# Patient Record
Sex: Female | Born: 1970
Health system: Southern US, Community
[De-identification: ages and names within clinical notes are randomized; demographics above are authoritative.]

## PROBLEM LIST (undated history)

## (undated) DIAGNOSIS — Z8711 Personal history of peptic ulcer disease: Secondary | ICD-10-CM

## (undated) DIAGNOSIS — Z8719 Personal history of other diseases of the digestive system: Secondary | ICD-10-CM

## (undated) DIAGNOSIS — N289 Disorder of kidney and ureter, unspecified: Secondary | ICD-10-CM

## (undated) DIAGNOSIS — E119 Type 2 diabetes mellitus without complications: Secondary | ICD-10-CM

## (undated) DIAGNOSIS — H543 Unqualified visual loss, both eyes: Secondary | ICD-10-CM

## (undated) DIAGNOSIS — F319 Bipolar disorder, unspecified: Secondary | ICD-10-CM

## (undated) DIAGNOSIS — E079 Disorder of thyroid, unspecified: Secondary | ICD-10-CM

## (undated) HISTORY — PX: TUBAL LIGATION: SHX77

---

## 2014-06-03 ENCOUNTER — Emergency Department (HOSPITAL_COMMUNITY)
Admission: EM | Admit: 2014-06-03 | Discharge: 2014-06-03 | Disposition: A | Discharge status: Home / Self Care | Payer: Medicare Other | Attending: Emergency Medicine | Admitting: Emergency Medicine

## 2014-06-03 ENCOUNTER — Emergency Department (HOSPITAL_COMMUNITY): Payer: Medicare Other

## 2014-06-03 ENCOUNTER — Encounter (HOSPITAL_COMMUNITY): Payer: Self-pay | Admitting: Emergency Medicine

## 2014-06-03 DIAGNOSIS — F319 Bipolar disorder, unspecified: Secondary | ICD-10-CM | POA: Diagnosis not present

## 2014-06-03 DIAGNOSIS — Z87448 Personal history of other diseases of urinary system: Secondary | ICD-10-CM | POA: Diagnosis not present

## 2014-06-03 DIAGNOSIS — R197 Diarrhea, unspecified: Secondary | ICD-10-CM | POA: Insufficient documentation

## 2014-06-03 DIAGNOSIS — Z79899 Other long term (current) drug therapy: Secondary | ICD-10-CM | POA: Diagnosis not present

## 2014-06-03 DIAGNOSIS — Z3202 Encounter for pregnancy test, result negative: Secondary | ICD-10-CM | POA: Insufficient documentation

## 2014-06-03 DIAGNOSIS — Z9889 Other specified postprocedural states: Secondary | ICD-10-CM | POA: Diagnosis not present

## 2014-06-03 DIAGNOSIS — Z794 Long term (current) use of insulin: Secondary | ICD-10-CM | POA: Insufficient documentation

## 2014-06-03 DIAGNOSIS — Z9851 Tubal ligation status: Secondary | ICD-10-CM | POA: Diagnosis not present

## 2014-06-03 DIAGNOSIS — R112 Nausea with vomiting, unspecified: Secondary | ICD-10-CM

## 2014-06-03 DIAGNOSIS — E119 Type 2 diabetes mellitus without complications: Secondary | ICD-10-CM | POA: Diagnosis not present

## 2014-06-03 DIAGNOSIS — A5903 Trichomonal cystitis and urethritis: Secondary | ICD-10-CM | POA: Diagnosis not present

## 2014-06-03 DIAGNOSIS — H540 Blindness, both eyes: Secondary | ICD-10-CM | POA: Insufficient documentation

## 2014-06-03 DIAGNOSIS — Z720 Tobacco use: Secondary | ICD-10-CM | POA: Insufficient documentation

## 2014-06-03 DIAGNOSIS — N390 Urinary tract infection, site not specified: Secondary | ICD-10-CM

## 2014-06-03 DIAGNOSIS — R1084 Generalized abdominal pain: Secondary | ICD-10-CM | POA: Diagnosis present

## 2014-06-03 DIAGNOSIS — Z8719 Personal history of other diseases of the digestive system: Secondary | ICD-10-CM | POA: Insufficient documentation

## 2014-06-03 DIAGNOSIS — E039 Hypothyroidism, unspecified: Secondary | ICD-10-CM | POA: Insufficient documentation

## 2014-06-03 HISTORY — DX: Type 2 diabetes mellitus without complications: E11.9

## 2014-06-03 HISTORY — DX: Personal history of peptic ulcer disease: Z87.11

## 2014-06-03 HISTORY — DX: Personal history of other diseases of the digestive system: Z87.19

## 2014-06-03 HISTORY — DX: Unqualified visual loss, both eyes: H54.3

## 2014-06-03 HISTORY — DX: Disorder of thyroid, unspecified: E07.9

## 2014-06-03 HISTORY — DX: Bipolar disorder, unspecified: F31.9

## 2014-06-03 HISTORY — DX: Disorder of kidney and ureter, unspecified: N28.9

## 2014-06-03 LAB — COMPREHENSIVE METABOLIC PANEL
ALT: 12 U/L (ref 0–35)
AST: 13 U/L (ref 0–37)
Albumin: 3.5 g/dL (ref 3.5–5.2)
Alkaline Phosphatase: 70 U/L (ref 39–117)
Anion gap: 15 (ref 5–15)
BUN: 14 mg/dL (ref 6–23)
CALCIUM: 9.1 mg/dL (ref 8.4–10.5)
CO2: 20 mEq/L (ref 19–32)
Chloride: 97 mEq/L (ref 96–112)
Creatinine, Ser: 0.65 mg/dL (ref 0.50–1.10)
GFR calc Af Amer: >90 mL/min (ref >90)
GFR calc non Af Amer: >90 mL/min (ref >90)
Glucose, Bld: 328 mg/dL — ABNORMAL HIGH (ref 70–99)
Potassium: 3.6 mEq/L — ABNORMAL LOW (ref 3.7–5.3)
Sodium: 132 mEq/L — ABNORMAL LOW (ref 137–147)
Total Bilirubin: <0.2 mg/dL — ABNORMAL LOW (ref 0.3–1.2)
Total Protein: 6.9 g/dL (ref 6.0–8.3)

## 2014-06-03 LAB — URINALYSIS, ROUTINE W REFLEX MICROSCOPIC
Bilirubin Urine: NEGATIVE
Glucose, UA: >1000 mg/dL — AB
Ketones, ur: 40 mg/dL — AB
Nitrite: NEGATIVE
PROTEIN: NEGATIVE mg/dL
Specific Gravity, Urine: 1.036 — ABNORMAL HIGH (ref 1.005–1.030)
UROBILINOGEN UA: 0.2 mg/dL (ref 0.0–1.0)
pH: 5.5 (ref 5.0–8.0)

## 2014-06-03 LAB — CBC WITH DIFFERENTIAL/PLATELET
BASOS ABS: 0 10*3/uL (ref 0.0–0.1)
Basophils Relative: 0 % (ref 0–1)
EOS ABS: 0.2 10*3/uL (ref 0.0–0.7)
Eosinophils Relative: 2 % (ref 0–5)
HEMATOCRIT: 38.8 % (ref 36.0–46.0)
Hemoglobin: 13.4 g/dL (ref 12.0–15.0)
Lymphocytes Relative: 14 % (ref 12–46)
Lymphs Abs: 1.5 10*3/uL (ref 0.7–4.0)
MCH: 31.2 pg (ref 26.0–34.0)
MCHC: 34.5 g/dL (ref 30.0–36.0)
MCV: 90.4 fL (ref 78.0–100.0)
Monocytes Absolute: 1 10*3/uL (ref 0.1–1.0)
Monocytes Relative: 9 % (ref 3–12)
NEUTROS ABS: 7.8 10*3/uL — AB (ref 1.7–7.7)
Neutrophils Relative %: 75 % (ref 43–77)
Platelets: 283 10*3/uL (ref 150–400)
RBC: 4.29 MIL/uL (ref 3.87–5.11)
RDW: 12.7 % (ref 11.5–15.5)
WBC: 10.6 10*3/uL — ABNORMAL HIGH (ref 4.0–10.5)

## 2014-06-03 LAB — URINE MICROSCOPIC-ADD ON

## 2014-06-03 LAB — CBG MONITORING, ED: Glucose-Capillary: 300 mg/dL — ABNORMAL HIGH (ref 70–99)

## 2014-06-03 LAB — PREGNANCY, URINE: PREG TEST UR: NEGATIVE

## 2014-06-03 LAB — LIPASE, BLOOD: LIPASE: 23 U/L (ref 11–59)

## 2014-06-03 MED ORDER — IOHEXOL 300 MG/ML  SOLN
50.0000 mL | Freq: Once | INTRAMUSCULAR | Status: AC | PRN
  Administered 2014-06-03: 50 mL via ORAL

## 2014-06-03 MED ORDER — ONDANSETRON HCL 4 MG/2ML IJ SOLN: 4.0000 mg | INTRAMUSCULAR | Status: DC | PRN

## 2014-06-03 MED ORDER — DICYCLOMINE HCL 20 MG PO TABS: 20.0000 mg | ORAL_TABLET | Freq: Four times a day (QID) | ORAL | Status: AC | PRN

## 2014-06-03 MED ORDER — OXYCODONE-ACETAMINOPHEN 5-325 MG PO TABS
2.0000 | ORAL_TABLET | Freq: Once | ORAL | Status: AC
  Administered 2014-06-03: 2 via ORAL
  Filled 2014-06-03: qty 2

## 2014-06-03 MED ORDER — SODIUM CHLORIDE 0.9 % IV SOLN
INTRAVENOUS | Status: DC
  Administered 2014-06-03: 10:00:00 via INTRAVENOUS

## 2014-06-03 MED ORDER — ONDANSETRON HCL 4 MG/2ML IJ SOLN
4.0000 mg | INTRAMUSCULAR | Status: AC | PRN
  Administered 2014-06-03 (×2): 4 mg via INTRAVENOUS
  Filled 2014-06-03 (×2): qty 2

## 2014-06-03 MED ORDER — DEXTROSE 5 % IV SOLN
1.0000 g | Freq: Once | INTRAVENOUS | Status: AC
  Administered 2014-06-03: 1 g via INTRAVENOUS
  Filled 2014-06-03: qty 10

## 2014-06-03 MED ORDER — CEPHALEXIN 500 MG PO CAPS: 500.0000 mg | ORAL_CAPSULE | Freq: Four times a day (QID) | ORAL | Status: AC

## 2014-06-03 MED ORDER — SODIUM CHLORIDE 0.9 % IV BOLUS (SEPSIS)
1000.0000 mL | Freq: Once | INTRAVENOUS | Status: AC
  Administered 2014-06-03: 1000 mL via INTRAVENOUS

## 2014-06-03 MED ORDER — MORPHINE SULFATE 4 MG/ML IJ SOLN
4.0000 mg | INTRAMUSCULAR | Status: AC | PRN
  Administered 2014-06-03 (×2): 4 mg via INTRAVENOUS
  Filled 2014-06-03 (×2): qty 1

## 2014-06-03 MED ORDER — PROMETHAZINE HCL 25 MG PO TABS: 25.0000 mg | ORAL_TABLET | Freq: Four times a day (QID) | ORAL | Status: AC | PRN

## 2014-06-03 MED ORDER — METRONIDAZOLE 500 MG PO TABS
2000.0000 mg | ORAL_TABLET | Freq: Once | ORAL | Status: AC
  Administered 2014-06-03: 2000 mg via ORAL
  Filled 2014-06-03: qty 4

## 2014-06-03 NOTE — ED Provider Notes (Signed)
CSN: [id number]     Arrival date & time 06/03/14  4132 History   First MD Initiated Contact with Patient 06/03/14 [phone]     Chief Complaint  Patient presents with  . Abdominal Pain  . Emesis  . Diarrhea    HPI Pt was seen at 1010. Per pt, c/o gradual onset and persistence of multiple intermittent episodes of N/V/D that began 1 week ago.   Describes the stools as "watery." Has been associated with generalized abd "pain." States she has been taking OTC imodium without improvement in her symptoms. Denies CP/SOB, no back pain, no fevers, no black or blood in stools or emesis.     Past Medical History  Diagnosis Date  . Blindness of both eyes   . History of stomach ulcers   . Diabetes mellitus without complication   . Thyroid disease     hypothyroidism  . Renal disorder   . Kidney disease   . Bipolar disorder    Past Surgical History  Procedure Laterality Date  . Cesarean section    . Tubal ligation      History  Substance Use Topics  . Smoking status: Current Some Day Smoker    Types: Cigarettes  . Smokeless tobacco: Not on file  . Alcohol Use: No    Review of Systems ROS: Statement: All systems negative except as marked or noted in the HPI; Constitutional: Negative for fever and chills. ; ; Eyes: Negative for eye pain, redness and discharge. ; ; ENMT: Negative for ear pain, hoarseness, nasal congestion, sinus pressure and sore throat. ; ; Cardiovascular: Negative for chest pain, palpitations, diaphoresis, dyspnea and peripheral edema. ; ; Respiratory: Negative for cough, wheezing and stridor. ; ; Gastrointestinal: +N/V/D, abd pain. Negative for blood in stool, hematemesis, jaundice and rectal bleeding. . ; ; Genitourinary: Negative for dysuria, flank pain and hematuria. ; ; Musculoskeletal: Negative for back pain and neck pain. Negative for swelling and trauma.; ; Skin: Negative for pruritus, rash, abrasions, blisters, bruising and skin lesion.; ; Neuro: Negative for headache,  lightheadedness and neck stiffness. Negative for weakness, altered level of consciousness , altered mental status, extremity weakness, paresthesias, involuntary movement, seizure and syncope.      Allergies  Biaxin; Septra; and Sulfa antibiotics  Home Medications   Prior to Admission medications   Medication Sig Start Date End Date Taking? Authorizing Provider  escitalopram (LEXAPRO) 20 MG tablet Take 20 mg by mouth daily.   Yes Historical Provider, MD  influenza vac recombinant HA trivalent (FLUBLOK) injection Inject 0.5 mLs into the muscle once.   Yes Historical Provider, MD  insulin glargine (LANTUS) 100 UNIT/ML injection Inject 30 Units into the skin at bedtime.   Yes Historical Provider, MD  Insulin Glulisine (APIDRA SOLOSTAR) 100 UNIT/ML Solostar Pen Inject 12 Units into the skin daily. Takes at [employer] of the day   Yes Historical Provider, MD  levothyroxine (SYNTHROID, LEVOTHROID) 125 MCG tablet Take 125 mcg by mouth daily before breakfast.   Yes Historical Provider, MD  lisinopril (PRINIVIL,ZESTRIL) 20 MG tablet Take 20 mg by mouth daily.   Yes Historical Provider, MD  pantoprazole (PROTONIX) 40 MG tablet Take 40 mg by mouth daily.   Yes Historical Provider, MD  topiramate (TOPAMAX) 100 MG tablet Take 100 mg by mouth 3 (three) times daily.   Yes Historical Provider, MD  trazodone (DESYREL) 300 MG tablet Take 300 mg by mouth at bedtime.   Yes Historical Provider, MD   BP 135/81  Pulse  70  Temp(Src) 98.1 F (36.7 C) (Oral)  Resp 16  SpO2 98%  LMP 05/20/2014 Physical Exam 1015: Physical examination:  Nursing notes reviewed; Vital signs and O2 SAT reviewed;  Constitutional: Well developed, Well nourished, Well hydrated, In no acute distress; Head:  Normocephalic, atraumatic; Eyes: EOMI, PERRL, No scleral icterus; ENMT: Mouth and pharynx normal, Mucous membranes moist; Neck: Supple, Full range of motion, No lymphadenopathy; Cardiovascular: Regular rate and rhythm, No murmur,  rub, or gallop; Respiratory: Breath sounds clear & equal bilaterally, No rales, rhonchi, wheezes.  Speaking full sentences with ease, Normal respiratory effort/excursion; Chest: Nontender, Movement normal; Abdomen: Soft, +mild diffuse tenderness to palp. No rebound or guarding. Nondistended, Normal bowel sounds; Genitourinary: No CVA tenderness; Extremities: Pulses normal, No tenderness, No edema, No calf edema or asymmetry.; Neuro: AA&Ox3, +blind per hx, otherwise major CN grossly intact.  Speech clear. No gross focal motor or sensory deficits in extremities.; Skin: Color normal, Warm, Dry.   ED Course  Procedures     EKG Interpretation None      MDM  MDM Reviewed: nursing note and vitals Interpretation: labs, CT scan and ECG     Date: 06/03/2014  Rate: 74  Rhythm: normal sinus rhythm  QRS Axis: normal  Intervals: normal  ST/T Wave abnormalities: normal  Conduction Disutrbances:none  Narrative Interpretation:   Old EKG Reviewed: none available.   Results for orders placed during the hospital encounter of 06/03/14  URINALYSIS, ROUTINE W REFLEX MICROSCOPIC      Result Value Ref Range   Color, Urine YELLOW  YELLOW   APPearance CLOUDY (*) CLEAR   Specific Gravity, Urine 1.036 (*) 1.005 - 1.030   pH 5.5  5.0 - 8.0   Glucose, UA >1000 (*) NEGATIVE mg/dL   Hgb urine dipstick TRACE (*) NEGATIVE   Bilirubin Urine NEGATIVE  NEGATIVE   Ketones, ur 40 (*) NEGATIVE mg/dL   Protein, ur NEGATIVE  NEGATIVE mg/dL   Urobilinogen, UA 0.2  0.0 - 1.0 mg/dL   Nitrite NEGATIVE  NEGATIVE   Leukocytes, UA SMALL (*) NEGATIVE  PREGNANCY, URINE      Result Value Ref Range   Preg Test, Ur NEGATIVE  NEGATIVE  CBC WITH DIFFERENTIAL      Result Value Ref Range   WBC 10.6 (*) 4.0 - 10.5 K/uL   RBC 4.29  3.87 - 5.11 MIL/uL   Hemoglobin 13.4  12.0 - 15.0 g/dL   HCT 16.1  09.6 - 04.5 %   MCV 90.4  78.0 - 100.0 fL   MCH 31.2  26.0 - 34.0 pg   MCHC 34.5  30.0 - 36.0 g/dL   RDW 40.9  81.1 - 91.4 %    Platelets 283  150 - 400 K/uL   Neutrophils Relative % 75  43 - 77 %   Neutro Abs 7.8 (*) 1.7 - 7.7 K/uL   Lymphocytes Relative 14  12 - 46 %   Lymphs Abs 1.5  0.7 - 4.0 K/uL   Monocytes Relative 9  3 - 12 %   Monocytes Absolute 1.0  0.1 - 1.0 K/uL   Eosinophils Relative 2  0 - 5 %   Eosinophils Absolute 0.2  0.0 - 0.7 K/uL   Basophils Relative 0  0 - 1 %   Basophils Absolute 0.0  0.0 - 0.1 K/uL  COMPREHENSIVE METABOLIC PANEL      Result Value Ref Range   Sodium 132 (*) 137 - 147 mEq/L   Potassium 3.6 (*) 3.7 - 5.3  mEq/L   Chloride 97  96 - 112 mEq/L   CO2 20  19 - 32 mEq/L   Glucose, Bld 328 (*) 70 - 99 mg/dL   BUN 14  6 - 23 mg/dL   Creatinine, Ser 1.610.65  0.50 - 1.10 mg/dL   Calcium 9.1  8.4 - 09.610.5 mg/dL   Total Protein 6.9  6.0 - 8.3 g/dL   Albumin 3.5  3.5 - 5.2 g/dL   AST 13  0 - 37 U/L   ALT 12  0 - 35 U/L   Alkaline Phosphatase 70  39 - 117 U/L   Total Bilirubin <0.2 (*) 0.3 - 1.2 mg/dL   GFR calc non Af Amer >90  >90 mL/min   GFR calc Af Amer >90  >90 mL/min   Anion gap 15  5 - 15  LIPASE, BLOOD      Result Value Ref Range   Lipase 23  11 - 59 U/L  URINE MICROSCOPIC-ADD ON      Result Value Ref Range   Squamous Epithelial / LPF FEW (*) RARE   WBC, UA 7-10  <3 WBC/hpf   RBC / HPF 0-2  <3 RBC/hpf   Bacteria, UA FEW (*) RARE   Urine-Other TRICHOMONAS PRESENT     Ct Abdomen Pelvis Wo Contrast 06/03/2014   CLINICAL DATA:  Generalized abdominal pain with emesis and diarrhea for approximately 1 week  EXAM: CT ABDOMEN AND PELVIS WITHOUT CONTRAST  TECHNIQUE: Multidetector CT imaging of the abdomen and pelvis was performed following the standard protocol without IV contrast material administration. Oral contrast was administered.  COMPARISON:  None.  FINDINGS: On axial slice 7 series 6, there is a 3 mm nodular opacity in the lateral segment of the left lower lobe. There is mild scarring in each lung base. Lung bases are otherwise clear.  Liver is enlarged, measuring 19.5 cm  in length. No focal liver lesions are identified. There is no appreciable biliary duct dilatation. Gallbladder wall is not thickened.  Spleen, pancreas, and adrenals appear normal. Kidneys bilaterally show no evidence of mass, calculus, or hydronephrosis on either side. There is no ureteral calculus on either side.  In the pelvis, the urinary bladder is midline with normal wall thickness. There is no pelvic mass or fluid collection. Appendix appears normal. Terminal ileum appears normal.  There is no bowel obstruction. There is no free air or portal venous air.  There are several small lymph nodes in the right mid to lower abdomen. By size criteria, there is no lymph node enlargement in the abdomen or pelvis. There is no ascites or abscess in the abdomen or pelvis. There is no demonstrable abdominal aortic aneurysm. There are no blastic or lytic bone lesions.  IMPRESSION: Small right sided abdominal lymph nodes. Significance of this finding is uncertain. A degree of mesenteric adenitis is possible. By size criteria, there is no frank adenopathy on this study.  Appendix appears normal. No bowel obstruction. No abscess. No renal or ureteral calculi. No hydronephrosis.  There is enlargement of the liver without focal liver lesion.  There is a 3 mm nodular lesion in the left base region. Followup of this nodular opacity should be based on Fleischner Society guidelines. If the patient is at high risk for bronchogenic carcinoma, follow-up chest CT at 1 year is recommended. If the patient is at low risk, no follow-up is needed. This recommendation follows the consensus statement: Guidelines for Management of Small Pulmonary Nodules Detected on CT Scans: A  Statement from the Fleischner Society as published in Radiology 2005; 237:395-400.   Electronically Signed   By: Bretta BangWilliam  Woodruff M.D.   On: 06/03/2014 12:21    1315:  Pt has tol PO well while in the ED without N/V.  No stooling while in the ED.  Abd benign, VSS. CBG  trending downward after IVF. States she feels better and wants to go home now. Pt is requesting "another dose of pain medicine before I go" as well as a rx for phenergan "because it works better." Tx for trichomonas in urine while in the ED. Tx for UTI with IV rocephin/PO keflex. Dx and testing d/w pt.   Questions answered.  Verb understanding, agreeable to d/c home with outpt f/u.    Samuel JesterKathleen Gaurav Baldree, DO 06/04/14 2020

## 2014-06-03 NOTE — ED Notes (Addendum)
Pt c/o generalized abdominal pain, emesis, diarrhea, and chills x 1 week.  Pain score 10/10.  Pt reports taking imodium w/o relief.  Sts she had flu shot around 2 weeks ago.  Sts she has been having heavy periods twice a month.

## 2014-06-03 NOTE — Discharge Instructions (Signed)
°Emergency Department Resource Guide °1) Find a Doctor and Pay Out of Pocket °Although you won't have to find out who is covered by your insurance plan, it is a good idea to ask around and get recommendations. You will then need to call the office and see if the doctor you have chosen will accept you as a new patient and what types of options they offer for patients who are self-pay. Some doctors offer discounts or will set up payment plans for their patients who do not have insurance, but you will need to ask so you aren't surprised when you get to your appointment. ° °2) Contact Your Local Health Department °Not all health departments have doctors that can see patients for sick visits, but many do, so it is worth a call to see if yours does. If you don't know where your local health department is, you can check in your phone book. The CDC also has a tool to help you locate your state's health department, and many state websites also have listings of all of their local health departments. ° °3) Find a Walk-in Clinic °If your illness is not likely to be very severe or complicated, you may want to try a walk in clinic. These are popping up all over the country in pharmacies, drugstores, and shopping centers. They're usually staffed by nurse practitioners or physician assistants that have been trained to treat common illnesses and complaints. They're usually fairly quick and inexpensive. However, if you have serious medical issues or chronic medical problems, these are probably not your best option. ° °No Primary Care Doctor: °- Call Health Connect at  832-8000 - they can help you locate a primary care doctor that  accepts your insurance, provides certain services, etc. °- Physician Referral Service- 1-800-533-3463 ° °Chronic Pain Problems: °Organization         Address  Phone   Notes  °[facility]  (336) 297-2271 Patients need to be referred by their primary care doctor.  ° °Medication  Assistance: °Organization         Address  Phone   Notes  °[place] [facility] [address] °[place], [place] [address] (336) 641-8030 --Must be a resident of [place] °-- Must have NO insurance coverage whatsoever (no Medicaid/ Medicare, etc.) °-- The pt. MUST have a primary care doctor that directs their care regularly and follows them in the community °  °[facility]  (866) 331-1348   °[employer]  (888) 892-1162   ° °Agencies that provide inexpensive medical care: °Organization         Address  Phone   Notes  °[facility]  (336) 832-8035   °[facility]    (336) 832-7272   °[facility] [address] °[place], [place] [address] (336) 832-4777   °[facility] [address], °[place] (336) 271-4999   °[facility]    (336) 373-0678   °[facility]    (336) 272-1050   °[facility] ° [address], [place] Phone:  (336) 832-4444, Fax:  (336) 832-4440 Hours of Operation:  9 am - 6 pm, M-F.  Also accepts Medicaid/Medicare and self-pay.  °[facility] ° [address], [place] Phone: (336) 832-3150, Fax: (336) [phone]. Hours of Operation:  8:30 am - 5:30 pm, M-F.  Also accepts Medicaid and self-pay.  °[facility] [place] [address], [place] Phone: (336) 878-6027   °[facility] [address], [place], [place] (336)723-1848, Ext. 123 Mondays & Thursdays: 7-9 AM.  First 15 patients are seen on a first come, first serve basis. °  ° °Medicaid-accepting [place] Providers: ° °Organization         Address  Phone   Notes  °[facility] [address], [place] (336) 641-2100 Also accepts self-pay patients.  °[facility] [address], [place] ° (336) 856-9996   °[facility] [address], [place]  (336) 288-8857   °[facility] [address] [place] [address], [place] (336) 299-7000   °Veita Bland [address], [place]  ° (336) 373-1557 Only accepts [place] Access Medicaid patients after they have their name applied to their card.  ° °Self-Pay (no insurance) in [place]: ° °Organization         Address  Phone   Notes  °Sickle Cell Patients, [facility] [address], [place] (336) 832-1970   °[facility] [address], [place] (336) 832-4400   °[facility] ° [address], [place] (336) 992-4800   °[facility]/Dr. Osei-Bonsu ° [address] [place] [address], [place] or [address], [place] (336) [phone] Phone number for both [place] and [place] locations is the same.  °[facility] [address], [place] (336) 299-0000   °[facility] [address] [place] [address], [place] or [address] (336) 852-7530 °(336) 878-2260   °[facility] [address], [place] (336) 350-1642, phone; (336) 294-5005, fax Sees patients 1st and 3rd Saturday of every month.  Must not qualify for public or private insurance (i.e. Medicaid, Medicare, [place] Health Choice, Veterans' Benefits) • Household income should be no more than 200% of the poverty level •The clinic cannot treat you if you are pregnant or think you are pregnant • Sexually transmitted diseases are not treated at the clinic.  ° ° °Dental Care: °Organization         Address  Phone  Notes  °[place] [facility] [address], [place] (336) 641-6152 Accepts children up to age 21 who are enrolled in Medicaid or [place] Health Choice; pregnant women with a Medicaid card; and children who have applied for Medicaid or [place] Health Choice, but were declined, whose parents can pay a reduced fee at time of service.  °[place]  [facility] [place]  [address], [place] (336) 641-7733 Accepts children up to age 21 who are enrolled in Medicaid or [place] Health Choice; pregnant women with a Medicaid card; and children who have applied for Medicaid or [place] Health Choice, but were declined, whose parents can pay a reduced fee at time of service.  °[facility] ° [address], [place] (336) 641-4533 Patients are seen by appointment only. Walk-ins are not accepted. [facility] will see patients 18 years of age and older. °Monday - Tuesday (8am-5pm) °Most Wednesdays (8:30-5pm) °$30 per visit, cash only  °[facility] ° [address], [place] (336) 641-4533 Patients are seen by appointment only. Walk-ins are not accepted. [facility] will see patients 18 years of age and older. °One   Wednesday Evening (Monthly: Volunteer Based).  $30 per visit, cash only  °[facility]  (919) 537-3737 for adults; Children under age 4, call Graduate Pediatric Dentistry at (919) [phone]. Children aged 4-14, please call (919) [phone] to request a pediatric application. ° Dental services are provided in all areas of dental care including fillings, crowns and bridges, complete and partial dentures, implants, gum treatment, root canals, and extractions. Preventive care is also provided. Treatment is provided to both adults and children. °Patients are selected via a lottery and there is often a waiting list. °  °[facility] [address], °[place] ° (336) 763-8833 [email or web address] °  °[facility] [address], [place], [place] (336)723-1848, Ext. 123 Second and Fourth Thursday of each month, opens at 6:30 AM; Clinic ends at 9 AM.  Patients are seen on a first-come first-served basis, and a limited number are seen during each clinic.  ° °[facility] ° [address], [place], [place] (336) 723-7904    Eligibility Requirements °You must have lived in [place], [place], or [place] counties for at least the last three months. °  You cannot be eligible for state or federal sponsored healthcare insurance, including Veterans Administration, Medicaid, or Medicare. °  You generally cannot be eligible for healthcare insurance through your employer.  °  How to apply: °Eligibility screenings are held every Tuesday and Wednesday afternoon from 1:00 pm until 4:00 pm. You do not need an appointment for the interview!  °[facility] [address], [place], [place] 336-631-2330   °[facility]  336-342-8273   °[facility]  336-703-3100   °[facility]  336-570-6415   ° °Behavioral Health Resources in the Community: °Intensive Outpatient Programs °Organization         Address  Phone  Notes  °[facility] [address], [place], [place] 336-878-6098   °[facility] [address], [place], [place] 336-832-9800   °[facility] [address], [place], [place] ° 336-882-2125   °[facility] [address],  °[place], [place] 1-800-853-5163 or 336-641-4981   °Substance Abuse Resources °Organization         Address  Phone  Notes  °[facility]  336-882-2125   °[facility]  336-784-9470   °[facility]  336-285-9073   °[facility]  336-845-3988   °Residential & Outpatient Substance Abuse Program  1-800-659-3381   °Psychological Services °Organization         Address  Phone  Notes  °[facility]  336- 832-9600   °[facility]  336- 378-7881   °[facility] [address], [place] 1-800-853-5163 or 336-641-4981   ° °Mobile Crisis Teams °Organization         Address  Phone  Notes  °[facility], Mobile Crisis Care Unit  1-877-626-1772   °Assertive °[facility] ° [address]  [place], [place] 336-834-9664   °Sharon DeEsch [address] °[place] 336-554-5454   ° °Self-Help/Support Groups °Organization         Address  Phone             Notes  °[facility] of  - variety of support groups  336- 373-1402 Call for more information  °Narcotics Anonymous (NA), [facility] [address], °[place]  2 meetings at this location  ° °  Residential Treatment Programs Organization         Address  Phone  Notes  [facility] [address],    [place]  [phone]   [facility]  [address], [place] [phone], [place], [place] [phone]   [facility] [address], [place] [phone] Admissions: 8am-3pm M-F  [facility] [address]    [place], [place] [phone](337)[phone]   [facility] [address], [place], [place] [phone]   [facility] [address]  [place], [place] [phone]   [facility] Intensive Outpatient [address], [place], [place] [phone]   [facility] ([facility] [address]  [place], [place] [phone] or [phone]   [facility] ([facility] [address] [place], [place] [phone] Accepts Medicaid  [facility] [address]  [place] [phone] Substance Abuse/Addiction Treatment   [facility]         Address  Phone  Notes  [facility]  863-038-9642(888) 763-212-4719   Angie FavaJulie Brannon, PhD [address], [address] [place], [place]   ([phone](336) (937) [phone] or [phone](336) [phone]   [facility]   [address] [place], [place] [phone](336) [phone]   [facility] [address], [place], [place] [phone](336) [phone] Insurance/Medicaid/sponsorship through [facility]  [facility] [address]                                    [place], [place] [phone](336) [phone] Therapy/tele-psych/case    [facility] [address]   [place], [place] [phone]) [phone]    Dr. Lolly MustacheArfeen  [phone]) [phone]   [facility]  [facility] 1) [address], [place] 2) [address], [place] 3)  [address], [place] [phone](336) [phone](336) [phone]  ([phone](336) [phone]   [facility] (831)443-5369(336) 662-850-2984 or 8720651902(336) 7047813599 (After Hours)       Take the prescriptions as directed.  Increase your fluid intake (ie:  Gatoraide) for the next few days.  Eat a bland diet and advance to your regular diet slowly as you can tolerate it.   Avoid full strength juices, as well as milk and milk products until your diarrhea has resolved.   Call your regular medical doctor today to schedule a follow up appointment this week. Call your OB/GYN doctor today to schedule a follow up appointment within the next week. Return to the Emergency Department immediately if not improving (or even worsening) despite taking the medicines as prescribed, any black or bloody stool or vomit, if you develop a fever over "101," or for any other concerns.

## 2014-06-03 NOTE — ED Notes (Signed)
Bed: [id number] Expected date:  Expected time:  Means of arrival:  Comments: Hold for EVS

## 2014-06-03 NOTE — Progress Notes (Signed)
[facility] is [facility] [place]

## 2014-06-29 ENCOUNTER — Emergency Department (HOSPITAL_COMMUNITY): Payer: Medicare Other

## 2014-06-29 ENCOUNTER — Encounter (HOSPITAL_COMMUNITY): Payer: Self-pay | Admitting: Emergency Medicine

## 2014-06-29 ENCOUNTER — Emergency Department (HOSPITAL_COMMUNITY)
Admission: EM | Admit: 2014-06-29 | Discharge: 2014-06-29 | Disposition: A | Discharge status: Home / Self Care | Payer: Medicare Other | Attending: Emergency Medicine | Admitting: Emergency Medicine

## 2014-06-29 DIAGNOSIS — E1165 Type 2 diabetes mellitus with hyperglycemia: Secondary | ICD-10-CM | POA: Diagnosis not present

## 2014-06-29 DIAGNOSIS — E039 Hypothyroidism, unspecified: Secondary | ICD-10-CM | POA: Insufficient documentation

## 2014-06-29 DIAGNOSIS — R059 Cough, unspecified: Secondary | ICD-10-CM

## 2014-06-29 DIAGNOSIS — Z8719 Personal history of other diseases of the digestive system: Secondary | ICD-10-CM | POA: Insufficient documentation

## 2014-06-29 DIAGNOSIS — Z79899 Other long term (current) drug therapy: Secondary | ICD-10-CM | POA: Insufficient documentation

## 2014-06-29 DIAGNOSIS — R739 Hyperglycemia, unspecified: Secondary | ICD-10-CM

## 2014-06-29 DIAGNOSIS — Z87448 Personal history of other diseases of urinary system: Secondary | ICD-10-CM | POA: Diagnosis not present

## 2014-06-29 DIAGNOSIS — Z794 Long term (current) use of insulin: Secondary | ICD-10-CM | POA: Diagnosis not present

## 2014-06-29 DIAGNOSIS — F319 Bipolar disorder, unspecified: Secondary | ICD-10-CM | POA: Diagnosis not present

## 2014-06-29 DIAGNOSIS — H540 Blindness, both eyes: Secondary | ICD-10-CM | POA: Insufficient documentation

## 2014-06-29 DIAGNOSIS — Z792 Long term (current) use of antibiotics: Secondary | ICD-10-CM | POA: Diagnosis not present

## 2014-06-29 DIAGNOSIS — R05 Cough: Secondary | ICD-10-CM | POA: Insufficient documentation

## 2014-06-29 LAB — BASIC METABOLIC PANEL
Anion gap: 16 — ABNORMAL HIGH (ref 5–15)
BUN: 21 mg/dL (ref 6–23)
CALCIUM: 9.9 mg/dL (ref 8.4–10.5)
CO2: 20 mEq/L (ref 19–32)
Chloride: 97 mEq/L (ref 96–112)
Creatinine, Ser: 0.74 mg/dL (ref 0.50–1.10)
GLUCOSE: 317 mg/dL — AB (ref 70–99)
POTASSIUM: 4.7 meq/L (ref 3.7–5.3)
Sodium: 133 mEq/L — ABNORMAL LOW (ref 137–147)

## 2014-06-29 LAB — CBC WITH DIFFERENTIAL/PLATELET
Basophils Absolute: 0 10*3/uL (ref 0.0–0.1)
Basophils Relative: 0 % (ref 0–1)
EOS PCT: 1 % (ref 0–5)
Eosinophils Absolute: 0.1 10*3/uL (ref 0.0–0.7)
HCT: 46.3 % — ABNORMAL HIGH (ref 36.0–46.0)
Hemoglobin: 16 g/dL — ABNORMAL HIGH (ref 12.0–15.0)
LYMPHS ABS: 1.7 10*3/uL (ref 0.7–4.0)
Lymphocytes Relative: 23 % (ref 12–46)
MCH: 31.7 pg (ref 26.0–34.0)
MCHC: 34.6 g/dL (ref 30.0–36.0)
MCV: 91.9 fL (ref 78.0–100.0)
Monocytes Absolute: 0.4 10*3/uL (ref 0.1–1.0)
Monocytes Relative: 5 % (ref 3–12)
Neutro Abs: 5.4 10*3/uL (ref 1.7–7.7)
Neutrophils Relative %: 71 % (ref 43–77)
PLATELETS: 244 10*3/uL (ref 150–400)
RBC: 5.04 MIL/uL (ref 3.87–5.11)
RDW: 12.7 % (ref 11.5–15.5)
WBC: 7.6 10*3/uL (ref 4.0–10.5)

## 2014-06-29 LAB — URINALYSIS, ROUTINE W REFLEX MICROSCOPIC
Bilirubin Urine: NEGATIVE
Glucose, UA: >1000 mg/dL — AB
Hgb urine dipstick: NEGATIVE
Ketones, ur: NEGATIVE mg/dL
LEUKOCYTES UA: NEGATIVE
Nitrite: NEGATIVE
PROTEIN: NEGATIVE mg/dL
Specific Gravity, Urine: 1.015 (ref 1.005–1.030)
UROBILINOGEN UA: 0.2 mg/dL (ref 0.0–1.0)
pH: 5 (ref 5.0–8.0)

## 2014-06-29 LAB — URINE MICROSCOPIC-ADD ON

## 2014-06-29 LAB — CBG MONITORING, ED
Glucose-Capillary: 297 mg/dL — ABNORMAL HIGH (ref 70–99)
Glucose-Capillary: 375 mg/dL — ABNORMAL HIGH (ref 70–99)
Glucose-Capillary: 348 mg/dL — ABNORMAL HIGH (ref 70–99)

## 2014-06-29 MED ORDER — INSULIN GLARGINE 100 UNIT/ML ~~LOC~~ SOLN: 8.0000 [IU] | Freq: Once | SUBCUTANEOUS | Status: DC

## 2014-06-29 MED ORDER — SODIUM CHLORIDE 0.9 % IV BOLUS (SEPSIS)
1000.0000 mL | Freq: Once | INTRAVENOUS | Status: AC
  Administered 2014-06-29: 1000 mL via INTRAVENOUS

## 2014-06-29 MED ORDER — INSULIN GLARGINE 100 UNIT/ML ~~LOC~~ SOLN
30.0000 [IU] | Freq: Once | SUBCUTANEOUS | Status: DC
  Filled 2014-06-29: qty 0.3

## 2014-06-29 MED ORDER — ONDANSETRON HCL 4 MG/2ML IJ SOLN
4.0000 mg | Freq: Once | INTRAMUSCULAR | Status: AC
  Administered 2014-06-29: 4 mg via INTRAVENOUS
  Filled 2014-06-29: qty 2

## 2014-06-29 MED ORDER — INSULIN GLARGINE 100 UNIT/ML ~~LOC~~ SOLN
30.0000 [IU] | Freq: Once | SUBCUTANEOUS | Status: AC
  Administered 2014-06-29: 30 [IU] via SUBCUTANEOUS
  Filled 2014-06-29: qty 0.3

## 2014-06-29 MED ORDER — INSULIN GLARGINE 100 UNIT/ML ~~LOC~~ SOLN: 30.0000 [IU] | Freq: Every day | SUBCUTANEOUS | Status: AC

## 2014-06-29 NOTE — ED Provider Notes (Signed)
CSN: [id number]     Arrival date & time 06/29/14  1400 History   First MD Initiated Contact with Patient 06/29/14 1644     Chief Complaint  Patient presents with  . Hyperglycemia     (Consider location/radiation/quality/duration/timing/severity/associated sxs/prior Treatment) HPI Comments: Patient is a 43 year old female with history of diabetes, thyroid disease, bipolar disorder who presents to ED for hyperglycemia. She reports that for the past 2 days her glucose levels have been very high. Today her CBG was 585. She took 25 mg of Apredra around 5 AM. Normally she only takes 12 mg. She has been out of her Lantus since yesterday. She is type II diabetic, but controls her diabetes only with insulin. She reports she is currently waiting for a insulin pump, but her job will not give her the time off required to get the pump. She ate 2 slices of pizza today. She reports that over the past 2 days she has had a very mild cough. The cough is nonproductive. She denies any fever, chills, chest pain, shortness of breath, nausea, vomiting, diarrhea.  Patient is a 43 y.o. female presenting with hyperglycemia. The history is provided by the patient. No language interpreter was used.  Hyperglycemia Associated symptoms: increased thirst   Associated symptoms: no abdominal pain, no chest pain, no dysuria, no fever, no nausea, no shortness of breath and no vomiting     Past Medical History  Diagnosis Date  . Blindness of both eyes   . History of stomach ulcers   . Diabetes mellitus without complication   . Thyroid disease     hypothyroidism  . Renal disorder   . Kidney disease   . Bipolar disorder    Past Surgical History  Procedure Laterality Date  . Cesarean section    . Tubal ligation     No family history on file. History  Substance Use Topics  . Smoking status: Current Some Day Smoker    Types: Cigarettes  . Smokeless tobacco: Not on file  . Alcohol Use: No   OB History    No data  available     Review of Systems  Constitutional: Negative for fever and chills.  Respiratory: Positive for cough. Negative for shortness of breath.   Cardiovascular: Negative for chest pain.  Gastrointestinal: Negative for nausea, vomiting and abdominal pain.  Endocrine: Positive for polydipsia.       Hyperglycemia  Genitourinary: Negative for dysuria.  All other systems reviewed and are negative.     Allergies  Biaxin; Septra; and Sulfa antibiotics  Home Medications   Prior to Admission medications   Medication Sig Start Date End Date Taking? Authorizing Provider  cephALEXin (KEFLEX) 500 MG capsule Take 1 capsule (500 mg total) by mouth 4 (four) times daily. 06/03/14   Samuel Jester, DO  dicyclomine (BENTYL) 20 MG tablet Take 1 tablet (20 mg total) by mouth every 6 (six) hours as needed for spasms (abdominal cramping). 06/03/14   Samuel Jester, DO  escitalopram (LEXAPRO) 20 MG tablet Take 20 mg by mouth daily.    Historical Provider, MD  influenza vac recombinant HA trivalent (FLUBLOK) injection Inject 0.5 mLs into the muscle once.    Historical Provider, MD  insulin glargine (LANTUS) 100 UNIT/ML injection Inject 30 Units into the skin at bedtime.    Historical Provider, MD  Insulin Glulisine (APIDRA SOLOSTAR) 100 UNIT/ML Solostar Pen Inject 12 Units into the skin daily. Takes at [employer] of the day    Historical Provider, MD  levothyroxine (SYNTHROID, LEVOTHROID) 125 MCG tablet Take 125 mcg by mouth daily before breakfast.    Historical Provider, MD  lisinopril (PRINIVIL,ZESTRIL) 20 MG tablet Take 20 mg by mouth daily.    Historical Provider, MD  pantoprazole (PROTONIX) 40 MG tablet Take 40 mg by mouth daily.    Historical Provider, MD  promethazine (PHENERGAN) 25 MG tablet Take 1 tablet (25 mg total) by mouth every 6 (six) hours as needed for nausea or vomiting. 06/03/14   Samuel JesterKathleen McManus, DO  topiramate (TOPAMAX) 100 MG tablet Take 100 mg by mouth 3 (three) times  daily.    Historical Provider, MD  trazodone (DESYREL) 300 MG tablet Take 300 mg by mouth at bedtime.    Historical Provider, MD   BP 125/72 mmHg  Pulse 74  Temp(Src) 98.4 F (36.9 C) (Oral)  Resp 18  SpO2 100%  LMP 05/20/2014 Physical Exam  Constitutional: She is oriented to person, place, and time. She appears well-developed and well-nourished. No distress.  HENT:  Head: Normocephalic and atraumatic.  Right Ear: External ear normal.  Left Ear: External ear normal.  Nose: Nose normal.  Mouth/Throat: Oropharynx is clear and moist. Mucous membranes are dry.  Eyes: Conjunctivae are normal.  Neck: Normal range of motion.  Cardiovascular: Normal rate, regular rhythm and normal heart sounds.   Pulmonary/Chest: Effort normal and breath sounds normal. No stridor. No respiratory distress. She has no wheezes. She has no rales.  Abdominal: Soft. She exhibits no distension.  Musculoskeletal: Normal range of motion.  Neurological: She is alert and oriented to person, place, and time. She has normal strength.  Skin: Skin is warm and dry. She is not diaphoretic. No erythema.  Psychiatric: She has a normal mood and affect. Her behavior is normal.  Nursing note and vitals reviewed.   ED Course  Procedures (including critical care time) Labs Review Labs Reviewed  BASIC METABOLIC PANEL - Abnormal; Notable for the following:    Sodium 133 (*)    Glucose, Bld 317 (*)    Anion gap 16 (*)    All other components within normal limits  CBC WITH DIFFERENTIAL - Abnormal; Notable for the following:    Hemoglobin 16.0 (*)    HCT 46.3 (*)    All other components within normal limits  URINALYSIS, ROUTINE W REFLEX MICROSCOPIC - Abnormal; Notable for the following:    Glucose, UA >1000 (*)    All other components within normal limits  CBG MONITORING, ED - Abnormal; Notable for the following:    Glucose-Capillary 297 (*)    All other components within normal limits  CBG MONITORING, ED - Abnormal;  Notable for the following:    Glucose-Capillary 375 (*)    All other components within normal limits  CBG MONITORING, ED - Abnormal; Notable for the following:    Glucose-Capillary 348 (*)    All other components within normal limits  URINE MICROSCOPIC-ADD ON  CBG MONITORING, ED    Imaging Review Dg Chest 2 View  06/29/2014   CLINICAL DATA:  Cough.  Chest congestion.  Shortness of breath.  EXAM: CHEST  2 VIEW  COMPARISON:  None.  FINDINGS: The heart size and mediastinal contours are within normal limits. Both lungs are clear. The visualized skeletal structures are unremarkable.  IMPRESSION: No active cardiopulmonary disease.   Electronically Signed   By: Myles RosenthalJohn  Stahl M.D.   On: 06/29/2014 18:56     EKG Interpretation None      MDM   Final diagnoses:  Cough  Hyperglycemia    Patient presents emergency department for evaluation of hyperglycemia. She was found to have a CBG of 375 on arrival. She has a normal CO2. She was given 2 L of fluid and her home Lantus. Patient was given new prescription for Lantus. She was given a work note for tomorrow. Patient is feeling improved. She will follow-up with her primary care provider. Discussed reasons to return to emergency Department immediately. Vital signs stable for discharge. Dr. Madilyn Hookees evaluated patient and agrees with plan. Patient / Family / Caregiver informed of clinical course, understand medical decision-making process, and agree with plan.   Mora BellmanHannah S Abrar Bilton, PA-C 07/01/14 0117  Tilden FossaElizabeth Rees, MD 07/03/14 [phone]

## 2014-06-29 NOTE — Discharge Instructions (Signed)
High Blood Sugar °High blood sugar (hyperglycemia) means that the level of sugar in your blood is higher than it should be. Signs of high blood sugar include: °· Feeling thirsty. °· Frequent peeing (urinating). °· Feeling tired or sleepy. °· Dry mouth. °· Vision changes. °· Feeling weak. °· Feeling hungry but losing weight. °· Numbness and tingling in your hands or feet. °· Headache. °When you ignore these signs, your blood sugar may keep going up. These problems may get worse, and other problems may begin. °HOME CARE °· Check your blood sugars as told by your doctor. Write down the numbers with the date and time. °· Take the right amount of insulin or diabetes pills at the right time. Write down the dose with date and time. °· Refill your insulin or diabetes pills before running out. °· Watch what you eat. Follow your meal plan. °· Drink liquids without sugar, such as water. Check with your doctor if you have kidney or heart disease. °· Follow your doctor's orders for exercise. Exercise at the same time of day. °· Keep your doctor's appointments. °GET HELP RIGHT AWAY IF:  °· You have trouble thinking or are confused. °· You have fast breathing with fruity smelling breath. °· You pass out (faint). °· You have 2 to 3 days of high blood sugars and you do not know why. °· You have chest pain. °· You are feeling sick to your stomach (nauseous) or throwing up (vomiting). °· You have sudden vision changes. °MAKE SURE YOU:  °· Understand these instructions. °· Will watch your condition. °· Will get help right away if you are not doing well or get worse. °Document Released: 06/03/2009 Document Revised: 10/29/2011 Document Reviewed: 06/03/2009 °ExitCare® Patient Information ©2015 [employer]. This information is not intended to replace advice given to you by your health care provider. Make sure you discuss any questions you have with your health care provider. ° °

## 2014-06-29 NOTE — ED Notes (Signed)
Per EMS-states she took CBG this am-it was 585-EMS, 275-patient took 25 mg of apredra

## 2015-11-13 IMAGING — CT CT ABD-PELV W/O CM
1 series · 15 of 32 positions shown, 19 images · non-contrast
Comparison: None.

CLINICAL DATA: Generalized abdominal pain with emesis and diarrhea
for approximately 1 week

EXAM:
CT ABDOMEN AND PELVIS WITHOUT CONTRAST
TECHNIQUE: Multidetector CT imaging of the abdomen and pelvis was performed
following the standard protocol without IV contrast material
administration. Oral contrast was administered.

[Series 6: lung · axial · 0.80mm/px · z∈[-385,-245]mm · 15 of 33 slices shown, 19 images]
[im 3/33  soft-tissue]
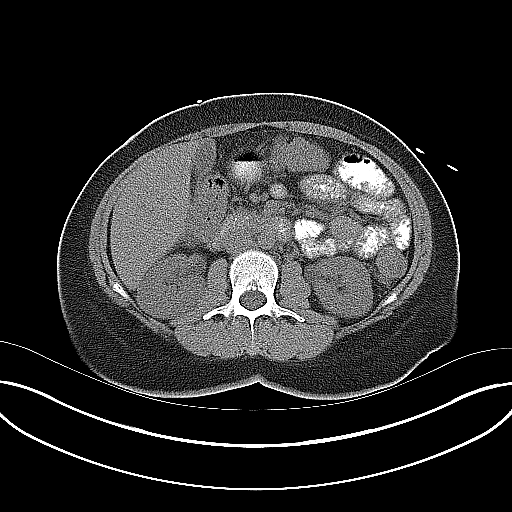
[im 3/33  bone]
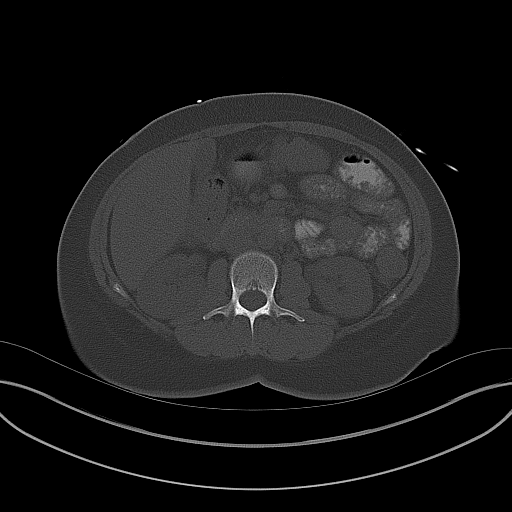
[im 5/33  soft-tissue]
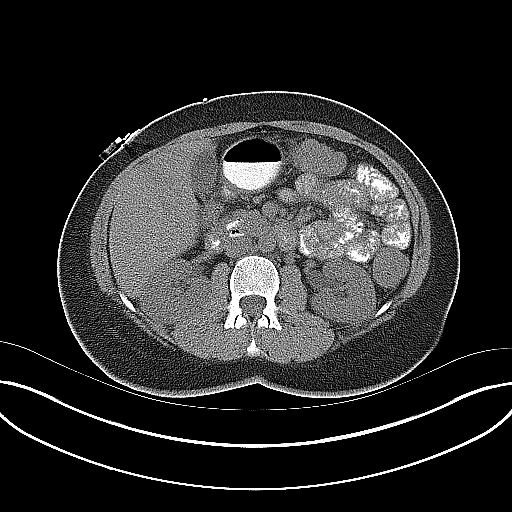
[im 7/33  soft-tissue]
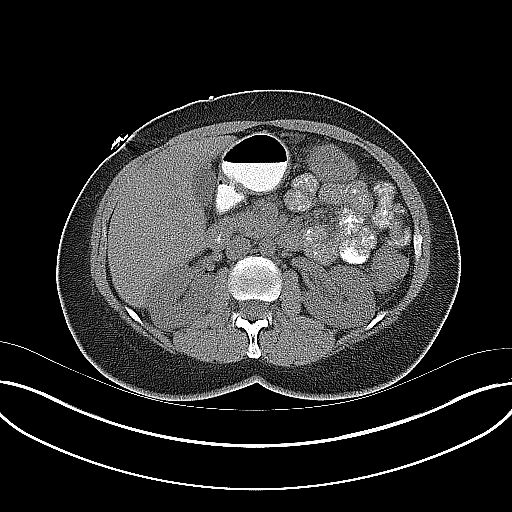
[im 10/33  soft-tissue]
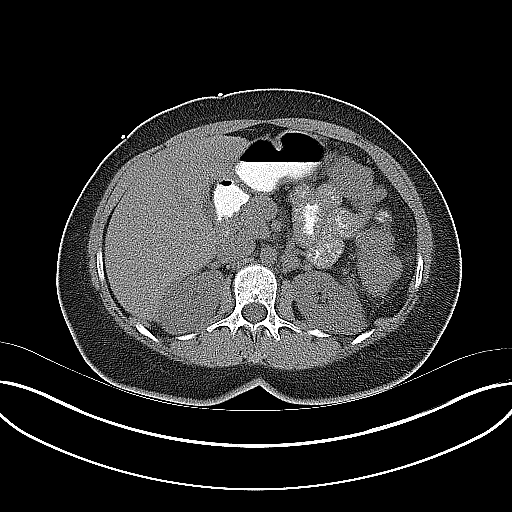
[im 12/33  soft-tissue]
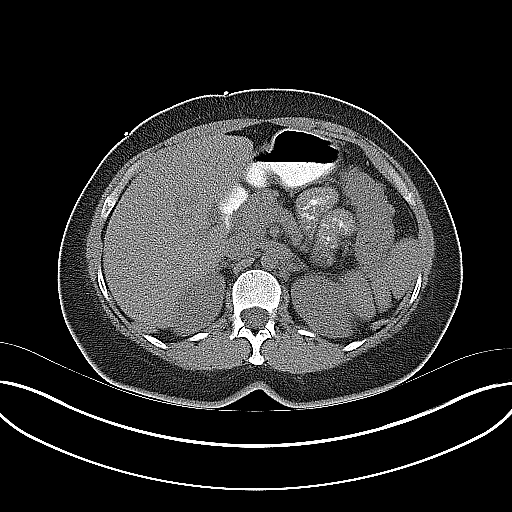
[im 14/33  soft-tissue]
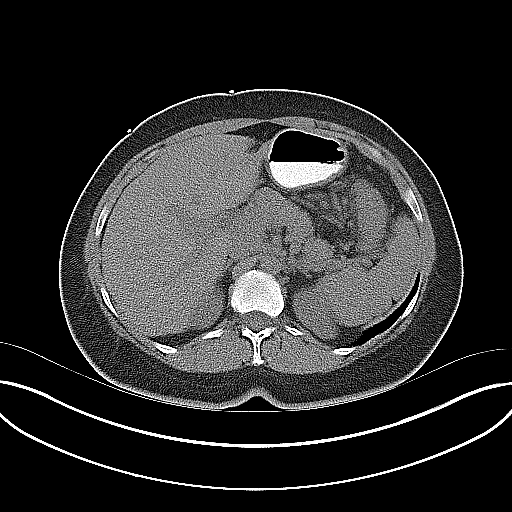
[im 17/33  soft-tissue]
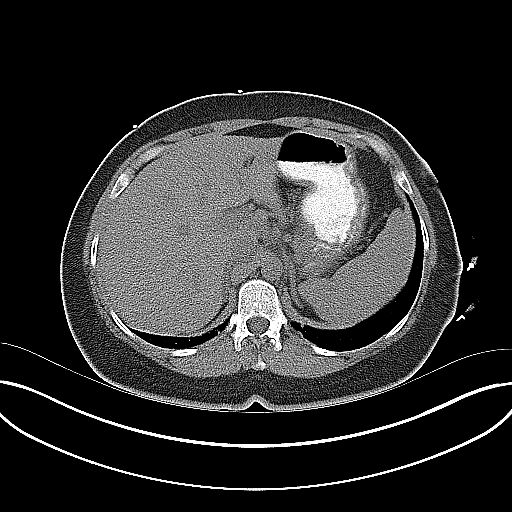
[im 19/33  soft-tissue]
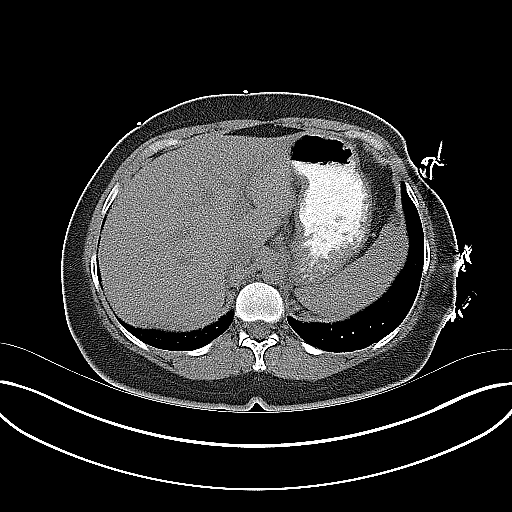
[im 21/33  soft-tissue]
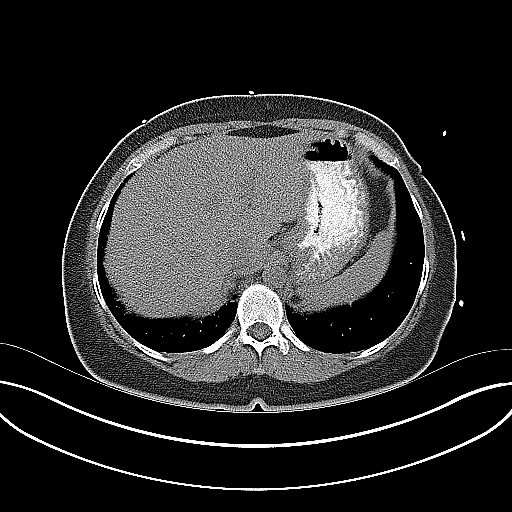
[im 21/33  bone]
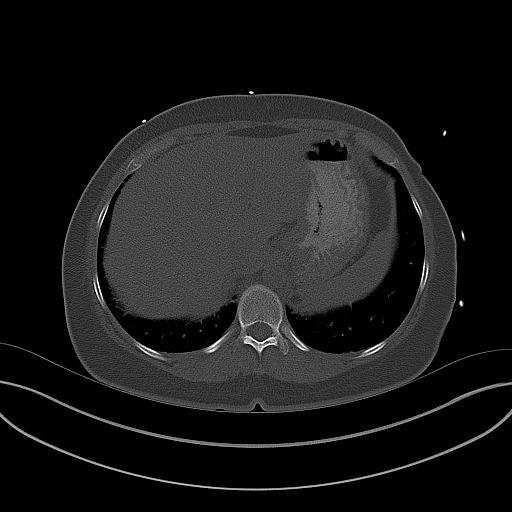
[im 23/33  soft-tissue]
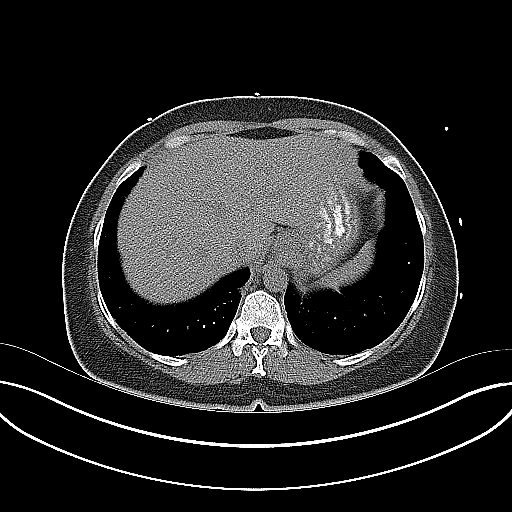
[im 26/33  soft-tissue]
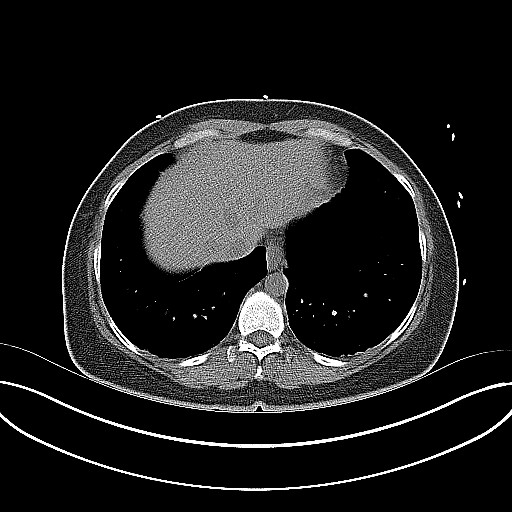
[im 28/33  soft-tissue]
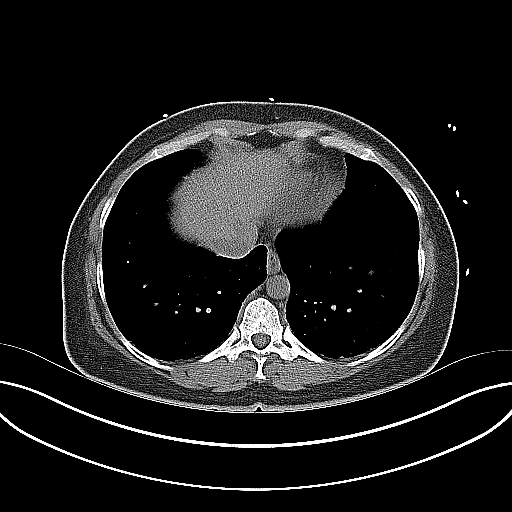
[im 28/33  lung]
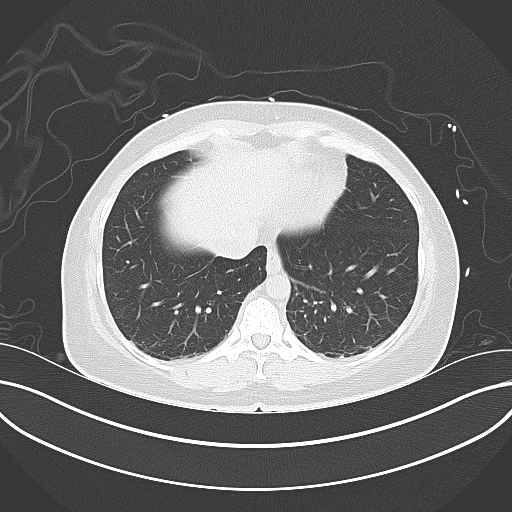
[im 29/33  lung]
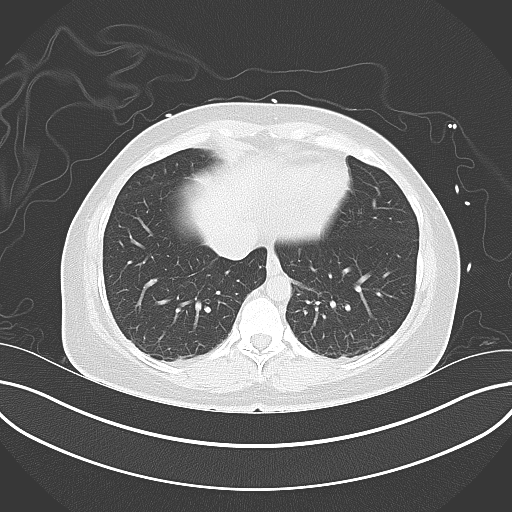
[im 30/33  soft-tissue]
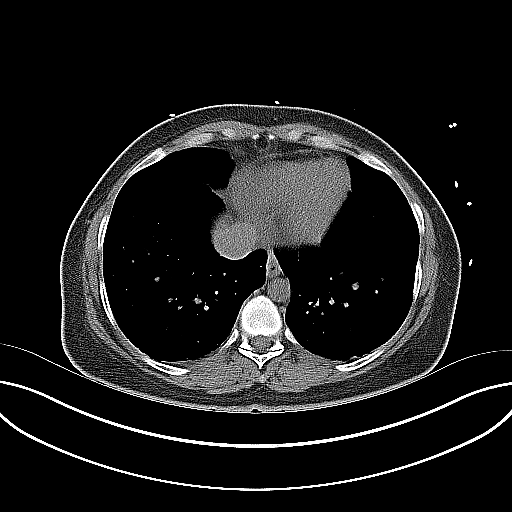
[im 30/33  lung]
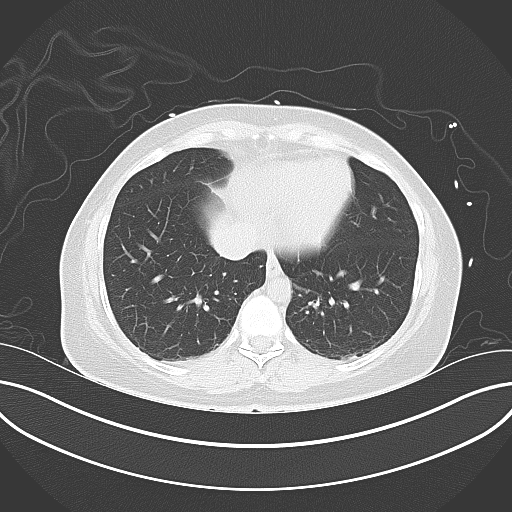
[im 31/33  lung]
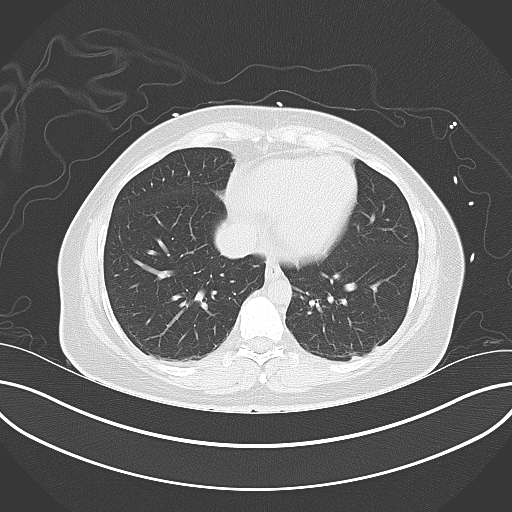

[15 of 32 positions shown; findings below may reference images not displayed]

FINDINGS: On axial slice 7 series 6, there is a 3 mm nodular opacity in the
lateral segment of the left lower lobe. There is mild scarring in
each lung base. Lung bases are otherwise clear.

Liver is enlarged, measuring 19.5 cm in length. No focal liver
lesions are identified. There is no appreciable biliary duct
dilatation. Gallbladder wall is not thickened.

Spleen, pancreas, and adrenals appear normal. Kidneys bilaterally
show no evidence of mass, calculus, or hydronephrosis on either
side. There is no ureteral calculus on either side.

In the pelvis, the urinary bladder is midline with normal wall
thickness. There is no pelvic mass or fluid collection. Appendix
appears normal. Terminal ileum appears normal.

There is no bowel obstruction. There is no free air or portal venous
air.

There are several small lymph nodes in the right mid to lower
abdomen. By size criteria, there is no lymph node enlargement in the
abdomen or pelvis. There is no ascites or abscess in the abdomen or
pelvis. There is no demonstrable abdominal aortic aneurysm. There
are no blastic or lytic bone lesions.
IMPRESSION: Small right sided abdominal lymph nodes. Significance of this
finding is uncertain. A degree of mesenteric adenitis is possible.
By size criteria, there is no frank adenopathy on this study.

Appendix appears normal. No bowel obstruction. No abscess. No renal
or ureteral calculi. No hydronephrosis.

There is enlargement of the liver without focal liver lesion.

There is a 3 mm nodular lesion in the left base region. Followup of
this nodular opacity should be based on [HOSPITAL]
guidelines. If the patient is at high risk for bronchogenic
carcinoma, follow-up chest CT at 1 year is recommended. If the
patient is at low risk, no follow-up is needed. This recommendation
follows the consensus statement: Guidelines for Management of Small
Pulmonary Nodules Detected on CT Scans: A Statement from the

## 2016-12-10 DIAGNOSIS — E119 Type 2 diabetes mellitus without complications: Secondary | ICD-10-CM | POA: Diagnosis not present

## 2016-12-10 DIAGNOSIS — S40011A Contusion of right shoulder, initial encounter: Secondary | ICD-10-CM | POA: Diagnosis not present

## 2016-12-10 DIAGNOSIS — Z794 Long term (current) use of insulin: Secondary | ICD-10-CM | POA: Diagnosis not present

## 2016-12-11 DIAGNOSIS — S4980XA Other specified injuries of shoulder and upper arm, unspecified arm, initial encounter: Secondary | ICD-10-CM | POA: Diagnosis not present

## 2016-12-11 DIAGNOSIS — I10 Essential (primary) hypertension: Secondary | ICD-10-CM | POA: Diagnosis not present

## 2016-12-11 DIAGNOSIS — E1165 Type 2 diabetes mellitus with hyperglycemia: Secondary | ICD-10-CM | POA: Diagnosis not present

## 2016-12-14 DIAGNOSIS — S4981XA Other specified injuries of right shoulder and upper arm, initial encounter: Secondary | ICD-10-CM | POA: Diagnosis not present

## 2016-12-19 DIAGNOSIS — M25511 Pain in right shoulder: Secondary | ICD-10-CM | POA: Diagnosis not present

## 2017-01-21 DIAGNOSIS — M25511 Pain in right shoulder: Secondary | ICD-10-CM | POA: Diagnosis not present

## 2017-02-13 DIAGNOSIS — E119 Type 2 diabetes mellitus without complications: Secondary | ICD-10-CM | POA: Diagnosis not present

## 2017-02-13 DIAGNOSIS — Z6822 Body mass index (BMI) 22.0-22.9, adult: Secondary | ICD-10-CM | POA: Diagnosis not present

## 2017-02-13 DIAGNOSIS — L282 Other prurigo: Secondary | ICD-10-CM | POA: Diagnosis not present

## 2017-02-13 DIAGNOSIS — I10 Essential (primary) hypertension: Secondary | ICD-10-CM | POA: Diagnosis not present
